# Patient Record
Sex: Male | Born: 1980 | Race: White | Hispanic: No | Marital: Single
Health system: Southern US, Community
[De-identification: ages and names within clinical notes are randomized; demographics above are authoritative.]

---

## 1999-03-08 ENCOUNTER — Emergency Department (HOSPITAL_COMMUNITY): Admission: EM | Admit: 1999-03-08 | Discharge: 1999-03-08 | Payer: Self-pay | Admitting: Emergency Medicine

## 2004-12-29 ENCOUNTER — Emergency Department: Payer: Self-pay | Admitting: Emergency Medicine

## 2006-07-24 ENCOUNTER — Emergency Department (HOSPITAL_COMMUNITY): Admission: EM | Admit: 2006-07-24 | Discharge: 2006-07-24 | Payer: Self-pay | Admitting: Emergency Medicine

## 2007-02-27 ENCOUNTER — Emergency Department (HOSPITAL_COMMUNITY): Admission: EM | Admit: 2007-02-27 | Discharge: 2007-02-27 | Payer: Self-pay | Admitting: Emergency Medicine

## 2007-04-26 ENCOUNTER — Emergency Department (HOSPITAL_COMMUNITY): Admission: EM | Admit: 2007-04-26 | Discharge: 2007-04-26 | Payer: Self-pay

## 2007-06-28 ENCOUNTER — Encounter: Admission: RE | Admit: 2007-06-28 | Discharge: 2007-06-28 | Payer: Self-pay | Admitting: Family Medicine

## 2008-07-16 ENCOUNTER — Encounter: Admission: RE | Admit: 2008-07-16 | Discharge: 2008-07-16 | Payer: Self-pay | Admitting: Family Medicine

## 2008-12-21 ENCOUNTER — Encounter: Admission: RE | Admit: 2008-12-21 | Discharge: 2008-12-21 | Payer: Self-pay | Admitting: Family Medicine

## 2009-03-03 IMAGING — CR DG WRIST COMPLETE 3+V*R*
4 series · 4 of 4 positions shown · non-contrast
Comparison: None

CLINICAL DATA: Hit wall now with pain and swelling

RIGHT WRIST - COMPLETE 3+ VIEW
TECHNIQUE:

[view not recorded (1 of 4)]
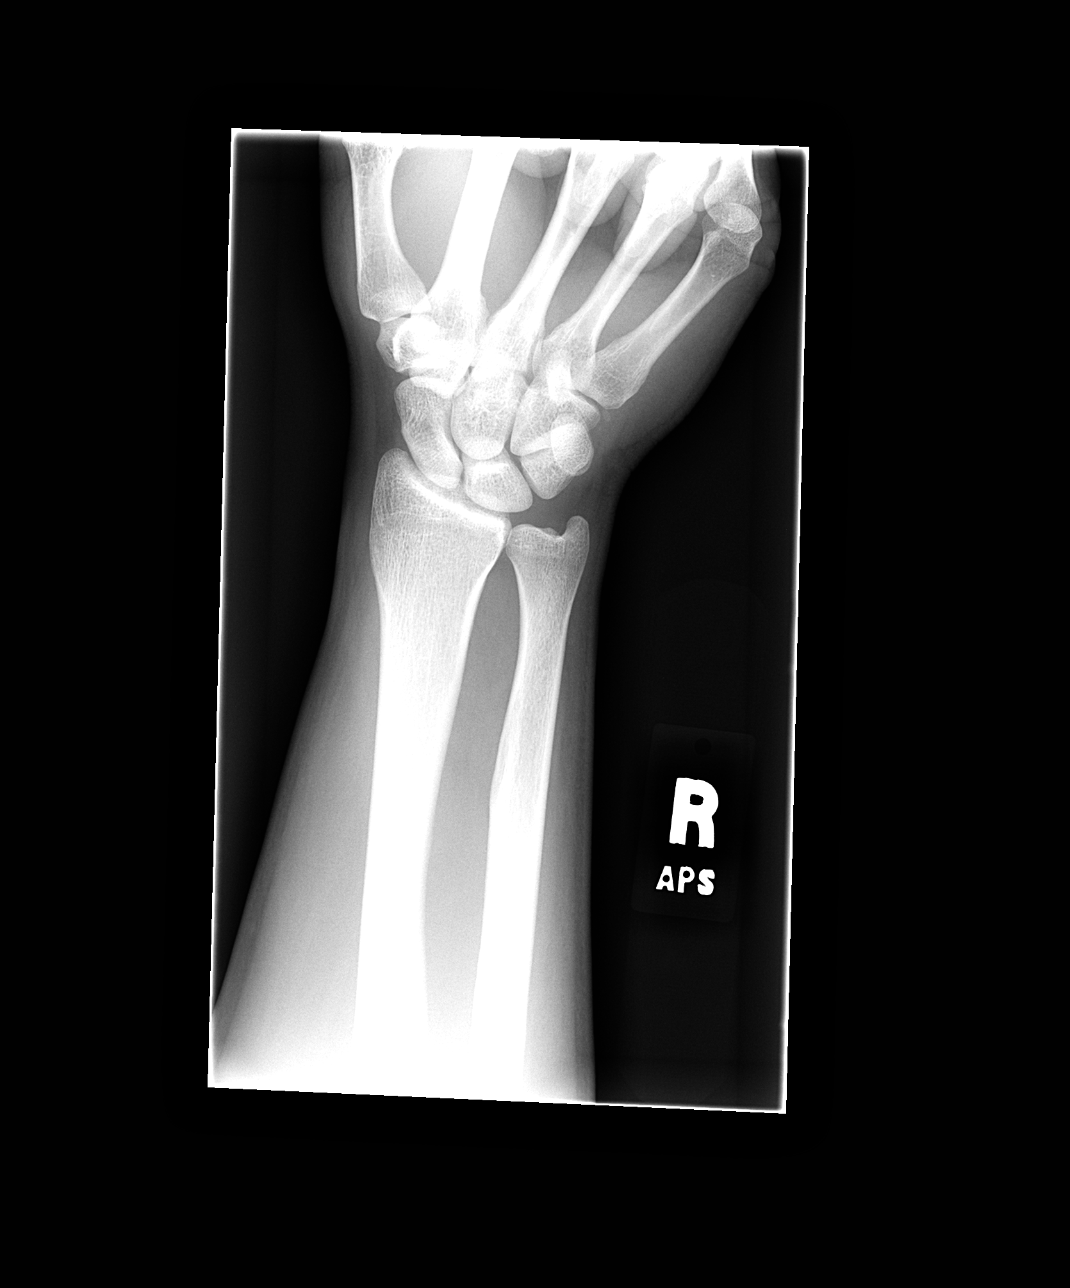

[view not recorded (2 of 4)]
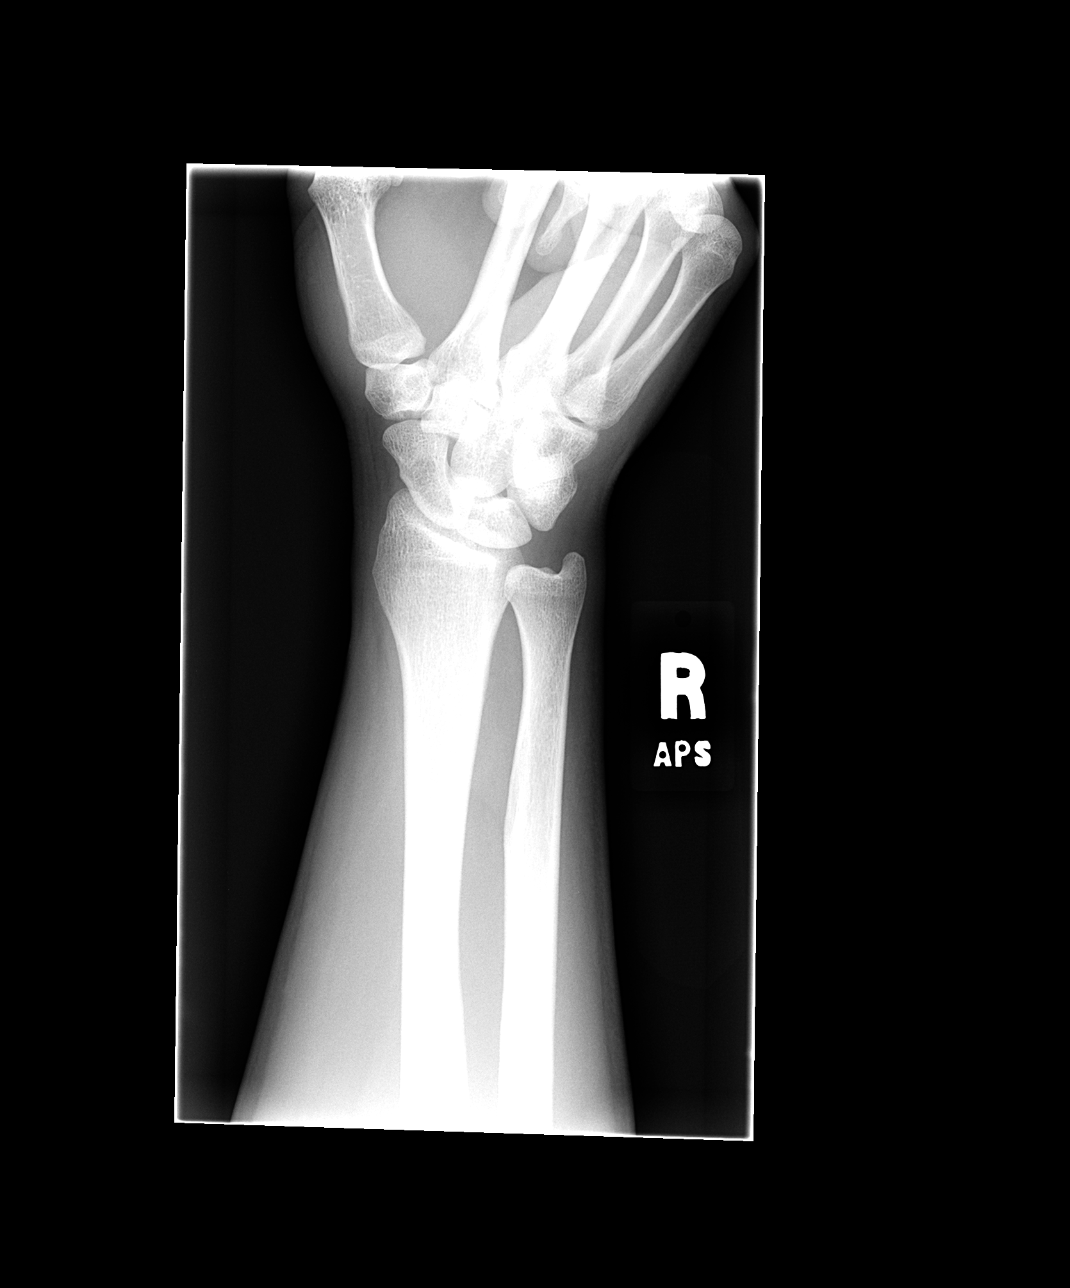

[view not recorded (3 of 4)]
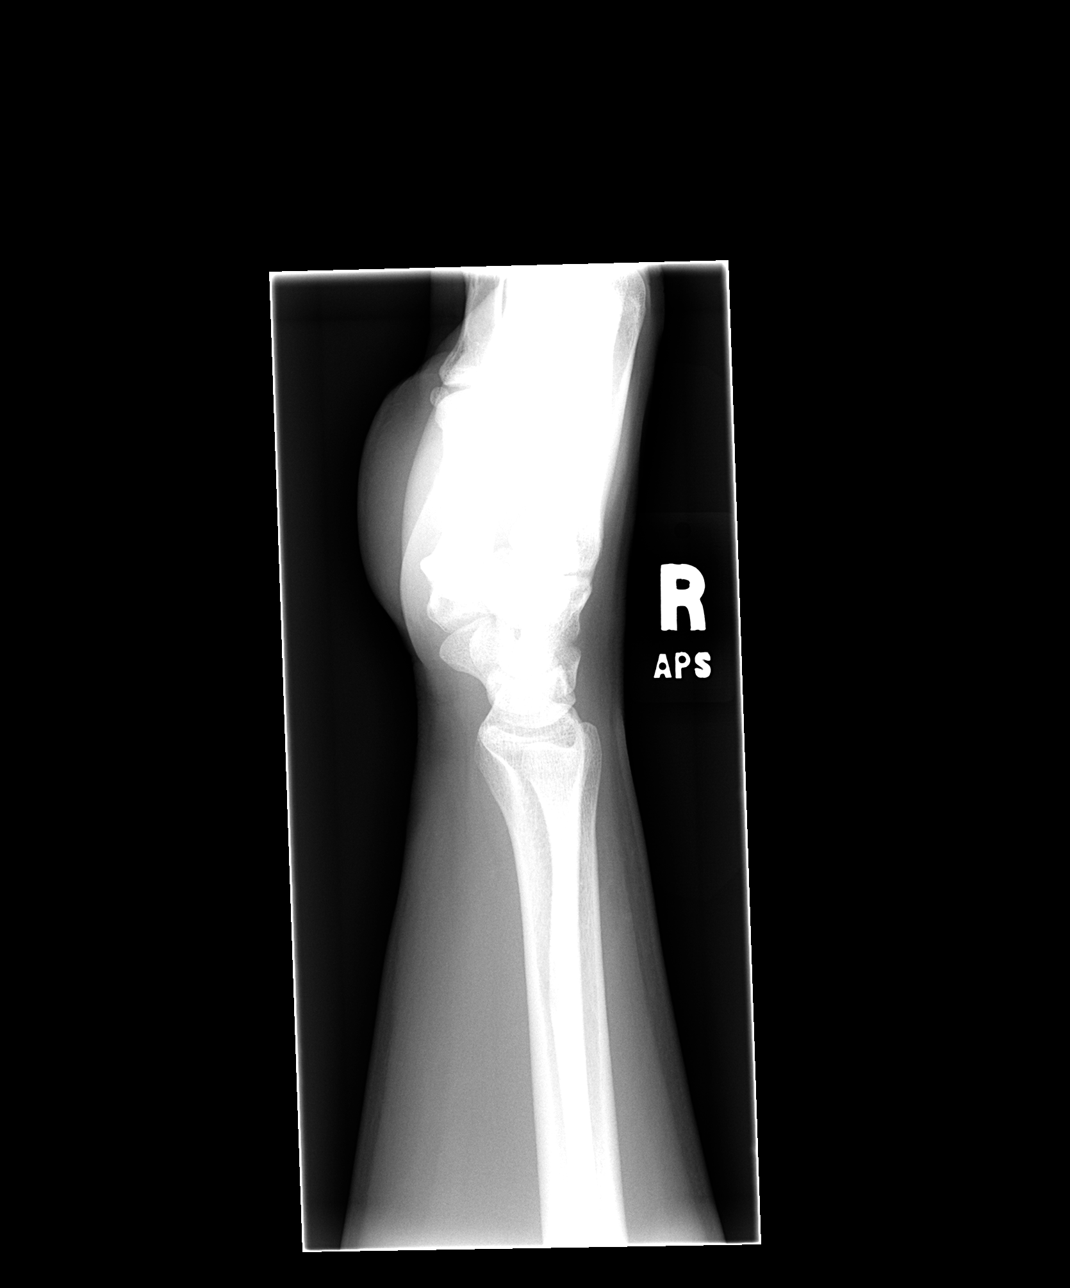

[view not recorded (4 of 4)]
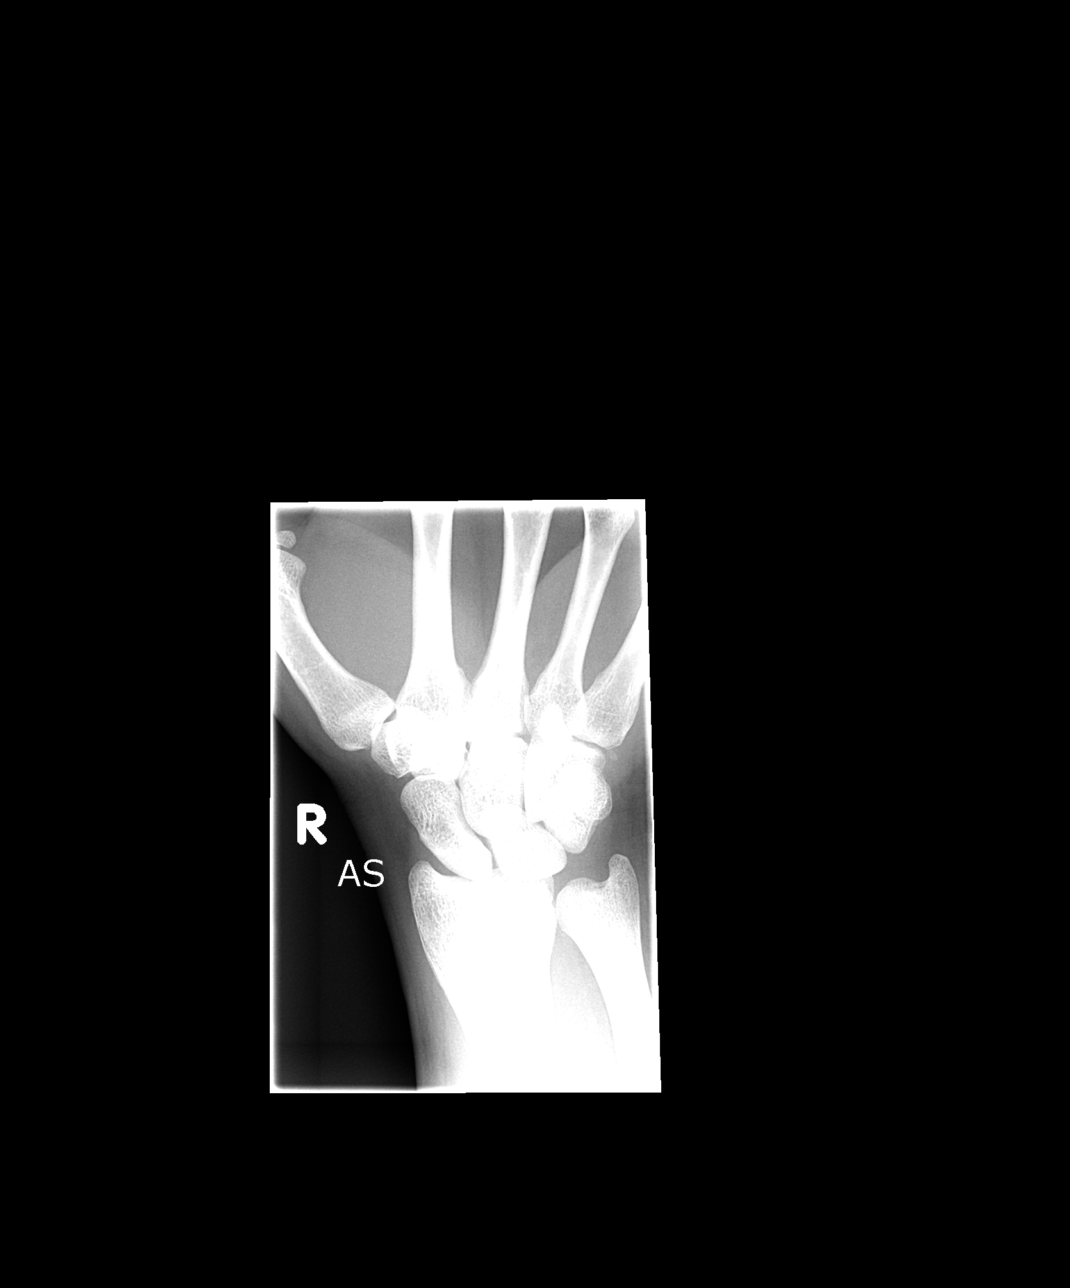

[4 of 4 positions shown; findings below may reference images not displayed]

FINDINGS: The radiocarpal joint space appears normal and the carpal
bones are in normal position.  No acute bony abnormality is seen
IMPRESSION: .  No acute bony abnormality.

## 2011-04-25 NOTE — H&P (Signed)
NAME:  Kenneth Powell, Kenneth Powell NO.:  [id number]   MEDICAL RECORD NO.:  [id number]          PATIENT TYPE:  EMS   LOCATION:  [facility]                         FACILITY:  [facility]   PHYSICIAN:  Adolph Pollack, M.D.DATE OF BIRTH:  1981/10/10   DATE OF ADMISSION:  04/26/2007  DATE OF DISCHARGE:                              HISTORY & PHYSICAL   CHIEF COMPLAINT:  Fall.   HISTORY OF PRESENT ILLNESS:  This is a 30 year old white male who fell  approximately 20 feet off of a roof onto a wooden floor.  There was  positive loss of consciousness at the scene.  He came in as a silver  trauma alert, with repetitive questioning, amnesia and complaints of  right ear pain.   PAST MEDICAL HISTORY:  Significant for seasonal allergies and  hypokalemia  Negative.   SOCIAL HISTORY:  Is positive only for oral tobacco use.  The patient  lives with his girlfriend and is employed as a Corporate investment banker.   ALLERGIES:  He has no known medication allergies.   CURRENT MEDICATIONS:  He is on Allegra-D and a multivitamin.   REVIEW OF SYSTEMS:  Are normal across all systems with the exception of  the above.   PHYSICAL EXAM:  Temperature is 98 degrees Fahrenheit, pulse 100,  respirations 16 unlabored, blood pressure 155/88, O2 sats are 100% on  room air.  SKIN: Showed a small abrasion on the right shoulder anteriorly and  lacerations of the right ear.  HEENT: The patient is normocephalic, atraumatic, pupils were PERRLA.  Extraocular movements are intact bilaterally with no injection  hemorrhage, edema, ecchymosis  and his vision was grossly intact.  Ears:  TMs were clear.  Eyes: These were clear.  The right auricle showed two  lesions, one through-and-through in the upper [place] but hearing was  grossly intact bilaterally.  Face:  no lesions, edema, ecchymosis.  Strength was grossly intact and no obvious oral trauma malocclusion.  NECK:  Nontender without lesions.  Range of motion was grossly  intact  without pain.  LUNGS:  Clear to auscultation bilaterally with normal chest excursion.  CV: Normal S1-S2, regular rate and rhythm without murmurs, rubs or  gallops.  Peripheral pulses were palpable.  ABDOMEN:  Soft and nontender with normoactive bowel sounds.  No  distension.  PELVIS:  No lesions.  The external exam was deferred.  There was  stability and no tenderness on the pelvis.  MUSCULOSKELETAL: The patient moves all extremities.  No deficits in  strength or sensation, no tenderness or deformity.  BACK:  No lesions, tenderness or bony step-offs.  NEURO:  GCS 15, oriented x3.  He was amnestic to the events surrounding  his fall and asks very repetitive although appropriate questions.   LABS:  The patient's white blood cell count is 9.1, hemoglobin 14.1,  hematocrit 41.7, platelets 300.  The patient's chest x-ray was negative for any abnormality.  Head and  neck CTs were negative for any abnormalities.  Please note on the chest  x-ray, the chest x-ray did demonstrate a slight loss of height around T7  but  again the patient was nontender in this area.   IMPRESSION AND PLAN:  1,  Fall  1. Severe concussion  2. Right ear laceration x2.  We have contacted Dr. Suszanne Conners and asked him      to come, and consult on the ear.  His girlfriend's is a Astronomer  and will be able to observe him overnight to      make sure his concussion and postconcussive symptoms  do not      worsen.  We will see him back in clinic on Thursday to make sure he      is safe for return to work.  If he has any questions or concerns      prior he or  his girlfriend can call.      Earney Hamburg, P.A.      Adolph Pollack, M.D.  Electronically Signed    MJ/MEDQ  D:  04/26/2007  T:  04/26/2007  Job:  [id number]

## 2020-07-02 ENCOUNTER — Emergency Department (HOSPITAL_COMMUNITY)
Admission: EM | Admit: 2020-07-02 | Discharge: 2020-07-02 | Disposition: A | Discharge status: Home / Self Care | Payer: No Typology Code available for payment source | Attending: Emergency Medicine | Admitting: Emergency Medicine

## 2020-07-02 ENCOUNTER — Other Ambulatory Visit: Payer: Self-pay

## 2020-07-02 DIAGNOSIS — Y9289 Other specified places as the place of occurrence of the external cause: Secondary | ICD-10-CM | POA: Diagnosis not present

## 2020-07-02 DIAGNOSIS — Y990 Civilian activity done for income or pay: Secondary | ICD-10-CM | POA: Insufficient documentation

## 2020-07-02 DIAGNOSIS — Y939 Activity, unspecified: Secondary | ICD-10-CM | POA: Diagnosis not present

## 2020-07-02 DIAGNOSIS — S0992XA Unspecified injury of nose, initial encounter: Secondary | ICD-10-CM | POA: Diagnosis present

## 2020-07-02 DIAGNOSIS — S0033XA Contusion of nose, initial encounter: Secondary | ICD-10-CM | POA: Diagnosis not present

## 2020-07-02 DIAGNOSIS — Z23 Encounter for immunization: Secondary | ICD-10-CM | POA: Diagnosis not present

## 2020-07-02 DIAGNOSIS — W208XXA Other cause of strike by thrown, projected or falling object, initial encounter: Secondary | ICD-10-CM | POA: Insufficient documentation

## 2020-07-02 MED ORDER — TETANUS-DIPHTH-ACELL PERTUSSIS 5-2.5-18.5 LF-MCG/0.5 IM SUSP
0.5000 mL | Freq: Once | INTRAMUSCULAR | Status: AC
  Administered 2020-07-02: 0.5 mL via INTRAMUSCULAR
  Filled 2020-07-02: qty 0.5

## 2020-07-02 NOTE — ED Triage Notes (Signed)
Pt states he was at work and a bar hit him on the bridge of his nose. Pt with bruising and slight bleeding.

## 2020-07-02 NOTE — ED Provider Notes (Signed)
[facility] EMERGENCY DEPARTMENT Provider Note   CSN: [id number] Arrival date & time: 07/02/20  6063     History No chief complaint on file.   Kenneth Powell is a 39 y.o. male.  Patient presents to the ED today for nasal injury and pain. He reports a metal bar fell on the bridge of his nose at work this morning, around 9:30am. He heard a "crack" and began bleeding from his nose. He did not have any loss of consciousness but does endorse a brief period of "seeing stars" immediately after the incident. No confusion per wife at bedside. Otherwise he denies headache, visual changes, nausea, vomiting, facial pain, jaw pain, difficulty breathing. The bleeding decreased with pressure and leaning forward; he now has a gauze dressing over his nose. His pain has steadily improved and he now reports only mild throbbing pain. He reports there is a small deviation in the bridge of his nose and a small scrape on the right side. He is able to breathe in and out through his nose normally. He does not take any medications, including anticoagulants. He has not had a tetanus vaccine in the last 10 years.        No past medical history on file.  There are no problems to display for this patient.   The histories are not reviewed yet. Please review them in the "History" navigator section and refresh this SmartLink.     No family history on file.  Social History   Tobacco Use  . Smoking status: Not on file  Substance Use Topics  . Alcohol use: Not on file  . Drug use: Not on file    Home Medications Prior to Admission medications   Not on File    Allergies    Patient has no known allergies.  Review of Systems   Review of Systems  Constitutional: Negative for fatigue.  HENT: Positive for facial swelling and nosebleeds. Negative for tinnitus.   Eyes: Negative for photophobia, pain and visual disturbance.  Respiratory: Negative for shortness of breath.     Cardiovascular: Negative for chest pain.  Gastrointestinal: Negative for nausea and vomiting.  Musculoskeletal: Negative for back pain, gait problem and neck pain.  Skin: Positive for wound.  Neurological: Negative for dizziness, weakness, light-headedness, numbness and headaches.  Psychiatric/Behavioral: Negative for confusion and decreased concentration.    Physical Exam Updated Vital Signs BP (!) 147/98   Pulse 89   Temp 98.4 F (36.9 C)   Resp 18   SpO2 96%   Physical Exam Vitals and nursing note reviewed.  Constitutional:      Appearance: He is well-developed.  HENT:     Head: Normocephalic. No raccoon eyes or Battle's sign.     Comments: No point tenderness over the orbits or zygoma area.  Full range of motion of the mandible without point tenderness or TMJ pain.  Dentition is intact.  No malocclusion.    Right Ear: Ear canal and external ear normal.     Left Ear: Ear canal and external ear normal.     Nose: Signs of injury, nasal tenderness and congestion present. No nasal deformity or septal deviation.     Right Nostril: Epistaxis present. No septal hematoma or occlusion.     Left Nostril: Epistaxis present. No septal hematoma or occlusion.      Comments: Patient has no active bleeding.  There is blood in the nose.  No substantial septal hematoma noted on either side. Eyes:  General: Lids are normal.     Conjunctiva/sclera: Conjunctivae normal.     Pupils: Pupils are equal, round, and reactive to light.     Comments: No visible hyphema  Cardiovascular:     Rate and Rhythm: Normal rate and regular rhythm.  Pulmonary:     Effort: Pulmonary effort is normal.     Breath sounds: Normal breath sounds.  Abdominal:     Palpations: Abdomen is soft.     Tenderness: There is no abdominal tenderness.  Musculoskeletal:        General: Normal range of motion.     Cervical back: Normal range of motion and neck supple. No tenderness or bony tenderness.     Thoracic back:  No tenderness or bony tenderness.     Lumbar back: No tenderness or bony tenderness.  Skin:    General: Skin is warm and dry.  Neurological:     Mental Status: He is alert and oriented to person, place, and time.     GCS: GCS eye subscore is 4. GCS verbal subscore is 5. GCS motor subscore is 6.     Cranial Nerves: No cranial nerve deficit.     Sensory: No sensory deficit.     Coordination: Coordination normal.     ED Results / Procedures / Treatments   Labs (all labs ordered are listed, but only abnormal results are displayed) Labs Reviewed - No data to display  EKG None  Radiology No results found.  Procedures Procedures (including critical care time)  Medications Ordered in ED Medications  Tdap (BOOSTRIX) injection 0.5 mL (has no administration in time range)    ED Course  I have reviewed the triage vital signs and the nursing notes.  Pertinent labs & imaging results that were available during my care of the patient were reviewed by me and considered in my medical decision making (see chart for details).  Patient seen and examined.   Vital signs reviewed and are as follows: BP (!) 147/98   Pulse 89   Temp 98.4 F (36.9 C)   Resp 18   SpO2 96%    Patient has an isolated nasal injury.  No signs of orbital injury or extraocular muscle entrapment.  Discussed limited utility of plain film x-rays with patient and wife.  They agreed to treat with ice, NSAIDs, pressure.  Patient's wife works at an ENT office.  We discussed indications to follow-up including any cosmetic concerns or airway patency concerns.  They are willing to do this.  Will update tetanus prior to discharge.  Patient was counseled on head injury precautions and symptoms that should indicate their return to the ED.  These include severe worsening headache, vision changes, confusion, loss of consciousness, trouble walking, nausea & vomiting, or weakness/tingling in extremities.      MDM  Rules/Calculators/A&P                          Patient with isolated nasal injury, suspect probable nasal bone fracture.  Airways are patent.  Nosebleed is controlled.  No signs of orbital injury.  No indication for advanced imaging at this time.  Follow-up instructions provided.   Final Clinical Impression(s) / ED Diagnoses Final diagnoses:  Contusion of nose, initial encounter    Rx / DC Orders ED Discharge Orders    None       Renne Crigler, PA-C 07/02/20 1219    Gwyneth Sprout, MD 07/02/20 1446

## 2020-07-02 NOTE — ED Notes (Signed)
Patient Alert and oriented to baseline. Stable and ambulatory to baseline. Patient verbalized understanding of the discharge instructions.  Patient belongings were taken by the patient.   

## 2020-07-02 NOTE — Discharge Instructions (Signed)
Please read and follow all provided instructions.  Your diagnoses today include:  1. Contusion of nose, initial encounter    Tests performed today include:  Vital signs. See below for your results today.   Medications prescribed:   None  Home care instructions:  Read the educational materials provided and follow any instructions contained in this packet.  If your nosebleed happens again: Pinch your nose and hold for 15 minutes without letting go.  If this does not stop the bleeding, pinch and hold for another 15 minutes.  If it continues to bleed after this, please come to the Emergency Department or see your family doctor.   Follow-up instructions: Please follow-up with your primary care provider or ENT in the next 7 days for further evaluation if you have difficulty breathing through the nose or concerns about the appearance of the nose.   Return instructions:   Please return to the Emergency Department if you experience worsening symptoms.   Please return if you have any other emergent concerns.  Additional Information:  Your vital signs today were: BP (!) 147/98   Pulse 89   Temp 98.4 F (36.9 C)   Resp 18   SpO2 96%  If your blood pressure (BP) was elevated above 135/85 this visit, please have this repeated by your doctor within one month. --------------
